# Patient Record
Sex: Female | Born: 1935 | Race: White | Hispanic: No | Marital: Married
Health system: Southern US, Community
[De-identification: ages and names within clinical notes are randomized; demographics above are authoritative.]

## PROBLEM LIST (undated history)

## (undated) DIAGNOSIS — I219 Acute myocardial infarction, unspecified: Secondary | ICD-10-CM

## (undated) DIAGNOSIS — I639 Cerebral infarction, unspecified: Secondary | ICD-10-CM

## (undated) DIAGNOSIS — I10 Essential (primary) hypertension: Secondary | ICD-10-CM

## (undated) DIAGNOSIS — E119 Type 2 diabetes mellitus without complications: Secondary | ICD-10-CM

---

## 2002-08-13 ENCOUNTER — Encounter: Payer: Self-pay | Admitting: Emergency Medicine

## 2002-08-13 ENCOUNTER — Inpatient Hospital Stay (HOSPITAL_COMMUNITY): Admission: EM | Admit: 2002-08-13 | Discharge: 2002-08-16 | Payer: Self-pay | Admitting: Emergency Medicine

## 2016-12-24 ENCOUNTER — Encounter (HOSPITAL_COMMUNITY): Payer: Self-pay | Admitting: Emergency Medicine

## 2016-12-24 ENCOUNTER — Emergency Department (HOSPITAL_COMMUNITY): Payer: Medicare Other

## 2016-12-24 ENCOUNTER — Emergency Department (HOSPITAL_COMMUNITY)
Admission: EM | Admit: 2016-12-24 | Discharge: 2016-12-24 | Disposition: A | Discharge status: Home / Self Care | Payer: Medicare Other | Attending: Emergency Medicine | Admitting: Emergency Medicine

## 2016-12-24 DIAGNOSIS — E119 Type 2 diabetes mellitus without complications: Secondary | ICD-10-CM | POA: Insufficient documentation

## 2016-12-24 DIAGNOSIS — W19XXXA Unspecified fall, initial encounter: Secondary | ICD-10-CM

## 2016-12-24 DIAGNOSIS — I10 Essential (primary) hypertension: Secondary | ICD-10-CM | POA: Insufficient documentation

## 2016-12-24 DIAGNOSIS — Z7982 Long term (current) use of aspirin: Secondary | ICD-10-CM | POA: Diagnosis not present

## 2016-12-24 DIAGNOSIS — Y998 Other external cause status: Secondary | ICD-10-CM | POA: Insufficient documentation

## 2016-12-24 DIAGNOSIS — I252 Old myocardial infarction: Secondary | ICD-10-CM | POA: Insufficient documentation

## 2016-12-24 DIAGNOSIS — Y92009 Unspecified place in unspecified non-institutional (private) residence as the place of occurrence of the external cause: Secondary | ICD-10-CM | POA: Diagnosis not present

## 2016-12-24 DIAGNOSIS — S300XXA Contusion of lower back and pelvis, initial encounter: Secondary | ICD-10-CM | POA: Insufficient documentation

## 2016-12-24 DIAGNOSIS — Y9301 Activity, walking, marching and hiking: Secondary | ICD-10-CM | POA: Insufficient documentation

## 2016-12-24 DIAGNOSIS — W010XXA Fall on same level from slipping, tripping and stumbling without subsequent striking against object, initial encounter: Secondary | ICD-10-CM | POA: Diagnosis not present

## 2016-12-24 DIAGNOSIS — Z8673 Personal history of transient ischemic attack (TIA), and cerebral infarction without residual deficits: Secondary | ICD-10-CM | POA: Insufficient documentation

## 2016-12-24 DIAGNOSIS — S20222A Contusion of left back wall of thorax, initial encounter: Secondary | ICD-10-CM

## 2016-12-24 DIAGNOSIS — R900 Intracranial space-occupying lesion found on diagnostic imaging of central nervous system: Secondary | ICD-10-CM | POA: Insufficient documentation

## 2016-12-24 DIAGNOSIS — Z794 Long term (current) use of insulin: Secondary | ICD-10-CM | POA: Insufficient documentation

## 2016-12-24 DIAGNOSIS — Z79899 Other long term (current) drug therapy: Secondary | ICD-10-CM | POA: Insufficient documentation

## 2016-12-24 DIAGNOSIS — G939 Disorder of brain, unspecified: Secondary | ICD-10-CM

## 2016-12-24 DIAGNOSIS — S3992XA Unspecified injury of lower back, initial encounter: Secondary | ICD-10-CM | POA: Diagnosis present

## 2016-12-24 HISTORY — DX: Acute myocardial infarction, unspecified: I21.9

## 2016-12-24 HISTORY — DX: Type 2 diabetes mellitus without complications: E11.9

## 2016-12-24 HISTORY — DX: Cerebral infarction, unspecified: I63.9

## 2016-12-24 HISTORY — DX: Essential (primary) hypertension: I10

## 2016-12-24 LAB — CBC WITH DIFFERENTIAL/PLATELET
BASOS PCT: 0 %
Basophils Absolute: 0 10*3/uL (ref 0.0–0.1)
EOS ABS: 0.3 10*3/uL (ref 0.0–0.7)
EOS PCT: 3 %
HCT: 39.3 % (ref 36.0–46.0)
HEMOGLOBIN: 12.8 g/dL (ref 12.0–15.0)
LYMPHS ABS: 1 10*3/uL (ref 0.7–4.0)
Lymphocytes Relative: 11 %
MCH: 30.3 pg (ref 26.0–34.0)
MCHC: 32.6 g/dL (ref 30.0–36.0)
MCV: 93.1 fL (ref 78.0–100.0)
Monocytes Absolute: 0.6 10*3/uL (ref 0.1–1.0)
Monocytes Relative: 7 %
NEUTROS PCT: 79 %
Neutro Abs: 7 10*3/uL (ref 1.7–7.7)
PLATELETS: 227 10*3/uL (ref 150–400)
RBC: 4.22 MIL/uL (ref 3.87–5.11)
RDW: 12.9 % (ref 11.5–15.5)
WBC: 8.9 10*3/uL (ref 4.0–10.5)

## 2016-12-24 LAB — COMPREHENSIVE METABOLIC PANEL
ALBUMIN: 3.5 g/dL (ref 3.5–5.0)
ALK PHOS: 103 U/L (ref 38–126)
ALT: 9 U/L — AB (ref 14–54)
AST: 22 U/L (ref 15–41)
Anion gap: 10 (ref 5–15)
BUN: 10 mg/dL (ref 6–20)
CHLORIDE: 102 mmol/L (ref 101–111)
CO2: 25 mmol/L (ref 22–32)
CREATININE: 0.8 mg/dL (ref 0.44–1.00)
Calcium: 9.3 mg/dL (ref 8.9–10.3)
GFR calc non Af Amer: >60 mL/min (ref >60)
GLUCOSE: 173 mg/dL — AB (ref 65–99)
Potassium: 3.5 mmol/L (ref 3.5–5.1)
SODIUM: 137 mmol/L (ref 135–145)
Total Bilirubin: 1.9 mg/dL — ABNORMAL HIGH (ref 0.3–1.2)
Total Protein: 6.9 g/dL (ref 6.5–8.1)

## 2016-12-24 LAB — CK: CK TOTAL: 187 U/L (ref 38–234)

## 2016-12-24 LAB — I-STAT TROPONIN, ED: Troponin i, poc: 0.01 ng/mL (ref 0.00–0.08)

## 2016-12-24 MED ORDER — SODIUM CHLORIDE 0.9 % IV BOLUS (SEPSIS)
1000.0000 mL | Freq: Once | INTRAVENOUS | Status: AC
  Administered 2016-12-24: 1000 mL via INTRAVENOUS

## 2016-12-24 NOTE — ED Provider Notes (Addendum)
[facility]-EMERGENCY DEPT Provider Note   CSN: [id number] Arrival date & time: 12/24/16  [id number]     History   Chief Complaint Chief Complaint  Patient presents with  . Fall    HPI Monica Thornton is a 83 y.o. female hx of DM, HTN, MI, previous stroke Here presenting with fall. Patient is currently at an independent living facility and states that she was walking yesterday and tripped and fell onto the left side. Patient was on the floor overnight and could not get up. She had an aide that came to see her and found her on the floor. Patient denies any passing out or chest pain or shortness of breath. Complaining of some left sided back pain and leg pain. Patient states that she is not on blood thinners.   The history is provided by the patient.    Past Medical History:  Diagnosis Date  . Diabetes mellitus without complication (HCC)   . Hypertension   . MI (myocardial infarction) (HCC)   . Stroke [facility](HCC)     There are no active problems to display for this patient.   No past surgical history on file.  OB History    No data available       Home Medications    Prior to Admission medications   Medication Sig Start Date End Date Taking? Authorizing Provider  aspirin EC 325 MG tablet Take 325 mg by mouth daily.   Yes [provider]  cefdinir (OMNICEF) 300 MG capsule Take 300 mg by mouth at bedtime.   Yes [provider]  DULoxetine (CYMBALTA) 30 MG capsule Take 30 mg by mouth daily.   Yes [provider]  Insulin Glargine (BASAGLAR KWIKPEN) 100 UNIT/ML SOPN Inject 30 Units into the skin daily after breakfast.   Yes [provider]  levothyroxine (SYNTHROID, LEVOTHROID) 75 MCG tablet Take 75 mcg by mouth daily before breakfast.   Yes [provider]  lisinopril (PRINIVIL,ZESTRIL) 40 MG tablet Take 40 mg by mouth daily.   Yes [provider]  metoprolol succinate (TOPROL-XL) 25 MG 24 hr tablet Take 25 mg by mouth daily.   Yes  [provider]  Multiple Vitamin (MULTIVITAMIN WITH MINERALS) TABS tablet Take 1 tablet by mouth daily.   Yes [provider]  oxybutynin (DITROPAN-XL) 10 MG 24 hr tablet Take 10 mg by mouth at bedtime.   Yes [provider]    Family History No family history on file.  Social History Social History  Substance Use Topics  . Smoking status: Not on file  . Smokeless tobacco: Not on file  . Alcohol use Not on file     Allergies   Avandia [rosiglitazone] and Ciprofloxacin   Review of Systems Review of Systems  Musculoskeletal: Positive for back pain.  All other systems reviewed and are negative.    Physical Exam Updated Vital Signs BP (!) 145/66   Pulse (!) 52   Temp 97.7 F (36.5 C) (Oral)   Resp (!) 7   SpO2 96%   Physical Exam  Constitutional: She is oriented to person, place, and time.  Chronically ill, slightly dehydrated   HENT:  Head: Normocephalic.  MM slightly dry, no obvious scalp hematoma   Eyes: Pupils are equal, round, and reactive to light. Conjunctivae and EOM are normal.  Neck: Normal range of motion. Neck supple.  Cardiovascular: Normal rate, regular rhythm and normal heart sounds.   Pulmonary/Chest: Effort normal and breath sounds normal. No respiratory distress. She has  no wheezes. She has no rales.  Abdominal: Soft. Bowel sounds are normal. She exhibits no distension. There is no tenderness.  Musculoskeletal:  Bruising L buttock, nl ROM bilateral hips, no obvious hip deformity. Mild L paralumbar tenderness, no midline tenderness. Mild tenderness R tibia, no obvious deformity   Neurological: She is alert and oriented to person, place, and time. No cranial nerve deficit. Coordination normal.  Skin: Skin is warm.  Psychiatric: She has a normal mood and affect.  Nursing note and vitals reviewed.    ED Treatments / Results  Labs (all labs ordered are listed, but only abnormal results are displayed) Labs Reviewed    COMPREHENSIVE METABOLIC PANEL - Abnormal; Notable for the following:       Result Value   Glucose, Bld 173 (*)    ALT 9 (*)    Total Bilirubin 1.9 (*)    All other components within normal limits  CBC WITH DIFFERENTIAL/PLATELET  CK  I-STAT TROPONIN, ED    EKG  EKG Interpretation  Date/Time:  Wednesday December 24 2016 09:48:27 EDT Ventricular Rate:  68 PR Interval:    QRS Duration: 115 QT Interval:  418 QTC Calculation: 445 R Axis:   -55 Text Interpretation:  Unknown rhythm, irregular rate Borderline prolonged PR interval Left anterior fascicular block LVH with secondary repolarization abnormality Anterior infarct, old No significant change since last tracing Confirmed by Richardean Canal [phone]) on 12/24/2016 9:57:23 AM       Radiology Dg Chest 1 View  Result Date: 12/24/2016 CLINICAL DATA:  Recent fall with chest pain, initial encounter EXAM: CHEST 1 VIEW COMPARISON:  None. FINDINGS: Cardiac shadow is enlarged. Lungs are well aerated bilaterally. No focal infiltrate is seen. Postsurgical changes are noted. Degenerative changes about the shoulder joints are seen. IMPRESSION: No active disease. Electronically Signed   By: Alcide Clever M.D.   On: 12/24/2016 11:03   Dg Lumbar Spine Complete  Result Date: 12/24/2016 CLINICAL DATA:  Recent fall today with low back pain, initial encounter EXAM: LUMBAR SPINE - COMPLETE 4+ VIEW COMPARISON:  None. FINDINGS: Five lumbar type vertebral bodies are well visualized. Vertebral body height is well maintained. Partial fusion of L2 on L3 is noted anteriorly. Multilevel disc space narrowing with osteophytic changes are noted. No pars defects are seen. Aortic calcifications are noted. No soft tissue abnormality is seen. IMPRESSION: Chronic changes without acute abnormality. Electronically Signed   By: Alcide Clever M.D.   On: 12/24/2016 11:00   Dg Pelvis 1-2 Views  Result Date: 12/24/2016 CLINICAL DATA:  Recent fall with pelvic pain, initial encounter  EXAM: PELVIS - 1-2 VIEW COMPARISON:  None. FINDINGS: Pelvic ring is intact. Degenerative changes of lumbar spine and hip joints are noted. No acute fracture is seen. No soft tissue abnormality is noted. IMPRESSION: Degenerative changes without acute abnormality. Electronically Signed   By: Alcide Clever M.D.   On: 12/24/2016 10:59   Dg Tibia/fibula Right  Result Date: 12/24/2016 CLINICAL DATA:  Recent fall today with leg pain, initial encounter EXAM: RIGHT TIBIA AND FIBULA - 2 VIEW COMPARISON:  None. FINDINGS: Right knee prosthesis is seen in satisfactory position. No findings to suggest loosening are seen. No acute fracture or dislocation is noted. Soft tissue calcifications are noted. Calcaneal spurring and tarsal degenerative changes are seen. IMPRESSION: Chronic changes without acute abnormality. Electronically Signed   By: Alcide Clever M.D.   On: 12/24/2016 10:59   Ct Head Wo Contrast  Result Date: 12/24/2016 CLINICAL DATA:  Fall.  EXAM: CT HEAD WITHOUT CONTRAST CT CERVICAL SPINE WITHOUT CONTRAST TECHNIQUE: Multidetector CT imaging of the head and cervical spine was performed following the standard protocol without intravenous contrast. Multiplanar CT image reconstructions of the cervical spine were also generated. COMPARISON:  None. FINDINGS: CT HEAD FINDINGS Brain: No evidence of acute infarction, hemorrhage, hydrocephalus or mass effect. There is a 10 mm focal hyperdense extra-axial lesion overlying the mid left superior frontal gyrus. Age-related cerebral atrophy with compensatory dilatation of the ventricles. Periventricular white matter and corona radiata hypodensities favor chronic ischemic microvascular white matter disease. Vascular: Intracranial atherosclerotic vascular calcifications. No hyperdense vessel. Skull: Normal. Negative for fracture or focal lesion. Sinuses/Orbits: Tiny mucous retention cysts in the bilateral maxillary sinuses. The remaining paranasal sinuses and mastoid air cells are  clear. The orbits are unremarkable. Other: None. CT CERVICAL SPINE FINDINGS Alignment: Trace anterolisthesis of C4 on C5. Trace retrolisthesis of C5 on C6. 3 mm anterolisthesis of T2 on T3. Skull base and vertebrae: No definite acute fracture. No primary bone lesion or focal pathologic process. Soft tissues and spinal canal: Mild prevertebral soft tissue swelling, measuring up to 9 mm at C3. No visible spinal canal hematoma. Disc levels: Multilevel degenerative changes throughout the cervical spine. Severe bilateral facet arthropathy. Upper chest: The visualized lungs are clear. Atherosclerotic vascular calcifications of the thoracic aorta and branch vessels. There is a 16 mm partially calcified right thyroid nodule. Other: None. IMPRESSION: 1. No definite acute intracranial abnormality. A 10 mm focal hyperdense extra-axial lesion overlying the mid left superior frontal gyrus is felt to be a meningioma given its location and configuration, with a small subdural hematoma thought to be much less likely given lack of other intracranial traumatic findings. 2. Mild prevertebral soft tissue swelling without discrete cervical spine fracture. Recommend noncontrast MRI cervical spine for further evaluation to evaluate for ligamentous injury. Electronically Signed   By: Obie Dredge M.D.   On: 12/24/2016 11:38   Ct Cervical Spine Wo Contrast  Result Date: 12/24/2016 CLINICAL DATA:  Fall. EXAM: CT HEAD WITHOUT CONTRAST CT CERVICAL SPINE WITHOUT CONTRAST TECHNIQUE: Multidetector CT imaging of the head and cervical spine was performed following the standard protocol without intravenous contrast. Multiplanar CT image reconstructions of the cervical spine were also generated. COMPARISON:  None. FINDINGS: CT HEAD FINDINGS Brain: No evidence of acute infarction, hemorrhage, hydrocephalus or mass effect. There is a 10 mm focal hyperdense extra-axial lesion overlying the mid left superior frontal gyrus. Age-related cerebral  atrophy with compensatory dilatation of the ventricles. Periventricular white matter and corona radiata hypodensities favor chronic ischemic microvascular white matter disease. Vascular: Intracranial atherosclerotic vascular calcifications. No hyperdense vessel. Skull: Normal. Negative for fracture or focal lesion. Sinuses/Orbits: Tiny mucous retention cysts in the bilateral maxillary sinuses. The remaining paranasal sinuses and mastoid air cells are clear. The orbits are unremarkable. Other: None. CT CERVICAL SPINE FINDINGS Alignment: Trace anterolisthesis of C4 on C5. Trace retrolisthesis of C5 on C6. 3 mm anterolisthesis of T2 on T3. Skull base and vertebrae: No definite acute fracture. No primary bone lesion or focal pathologic process. Soft tissues and spinal canal: Mild prevertebral soft tissue swelling, measuring up to 9 mm at C3. No visible spinal canal hematoma. Disc levels: Multilevel degenerative changes throughout the cervical spine. Severe bilateral facet arthropathy. Upper chest: The visualized lungs are clear. Atherosclerotic vascular calcifications of the thoracic aorta and branch vessels. There is a 16 mm partially calcified right thyroid nodule. Other: None. IMPRESSION: 1. No definite acute intracranial abnormality. A 10  mm focal hyperdense extra-axial lesion overlying the mid left superior frontal gyrus is felt to be a meningioma given its location and configuration, with a small subdural hematoma thought to be much less likely given lack of other intracranial traumatic findings. 2. Mild prevertebral soft tissue swelling without discrete cervical spine fracture. Recommend noncontrast MRI cervical spine for further evaluation to evaluate for ligamentous injury. Electronically Signed   By: Obie Dredge M.D.   On: 12/24/2016 11:38    Procedures Procedures (including critical care time)  Medications Ordered in ED Medications  sodium chloride 0.9 % bolus 1,000 mL (0 mLs Intravenous Stopped  12/24/16 1302)     Initial Impression / Assessment and Plan / ED Course  I have reviewed the triage vital signs and the nursing notes.  Pertinent labs & imaging results that were available during my care of the patient were reviewed by me and considered in my medical decision making (see chart for details).     Victoriah Wilds is a 83 y.o. female here with fall. Has bruising L buttock. Consider pelvic vs hip fracture vs rhabdo. Will get labs, CT head/neck, xrays.   1:06 PM CT head, neck unremarkable. Xrays showed no fracture. Able to ambulate. I doubt occult fracture. Of note, patient has small extra axial lesion, no signs of scalp hematoma on my exam I doubt subdural and this can be followed up outpatient. Will dc back to facility.   Final Clinical Impressions(s) / ED Diagnoses   Final diagnoses:  Fall, initial encounter  Back contusion, left, initial encounter  Brain lesion    New Prescriptions New Prescriptions   No medications on file     Charlynne Pander, MD 12/24/16 1303    Charlynne Pander, MD 12/24/16 [phone]

## 2016-12-24 NOTE — ED Triage Notes (Signed)
[facility] after being found on the floor of her apartment this morning by staff.  EMS reports patient told them the patient looked at her watch when she fell and that she fell at 1400 yesterday.  Patient is alert and oriented at this time, complains of no pain.  No obvious deformities, bruising noted to lower back.

## 2016-12-24 NOTE — ED Notes (Signed)
Pt ambulated well with a walker, steady gait

## 2016-12-24 NOTE — Discharge Instructions (Signed)
Take tylenol as needed for pain.   Expect bruising on your buttock area to get worse before it gets better. Apply ice on it today.   You have a small brain lesion that was there before and can be followed up with your doctor later   See your doctor  Return to ER if you have severe pain, unable to walk, passing out, headaches, vomiting.

## 2018-11-27 DEATH — deceased

## 2019-02-11 IMAGING — CT CT HEAD W/O CM
4 of 8 series · 16 of 47 positions shown, 18 images · non-contrast
Comparison: None.

CLINICAL DATA: Fall.

EXAM:
CT HEAD WITHOUT CONTRAST
CT CERVICAL SPINE WITHOUT CONTRAST
TECHNIQUE: Multidetector CT imaging of the head and cervical spine was
performed following the standard protocol without intravenous
contrast. Multiplanar CT image reconstructions of the cervical spine
were also generated.

[Series 6: head bone · axial · 0.44mm/px · z∈[-119,-35]mm · 4 of 86 slices shown]
[im 15/86  bone]
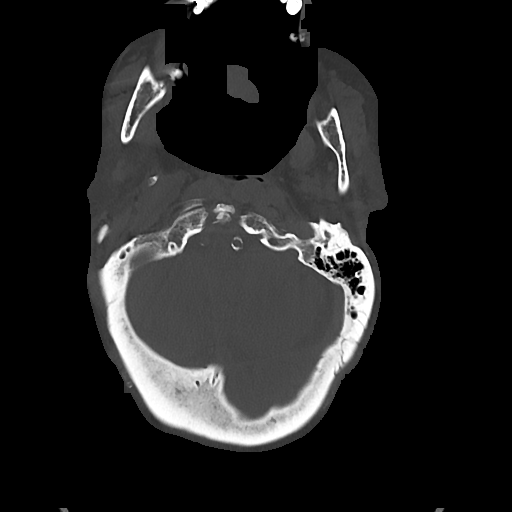
[im 29/86  bone]
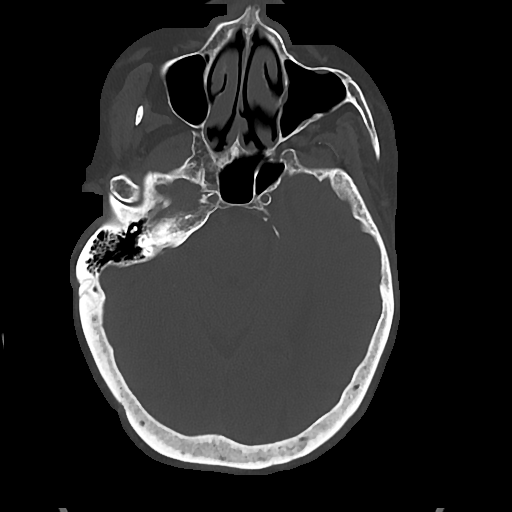
[im 43/86  bone]
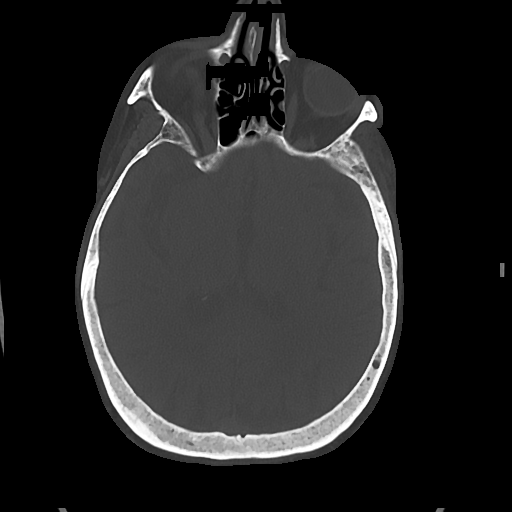
[im 57/86  bone]
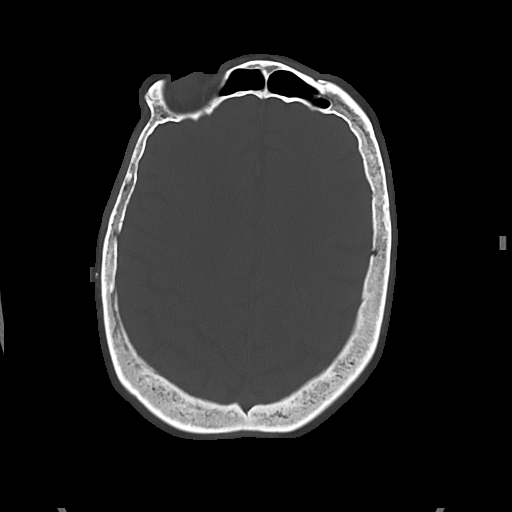

[Series 7: cor soft · coronal · 0.32mm/px · 3 of 67 slices shown]
[im 17/67  brain]
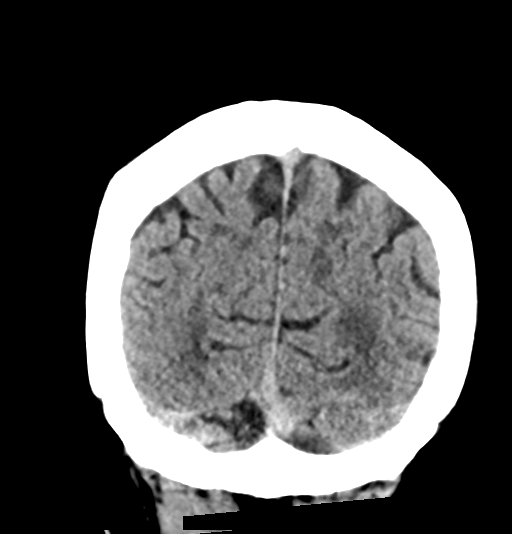
[im 34/67  brain]
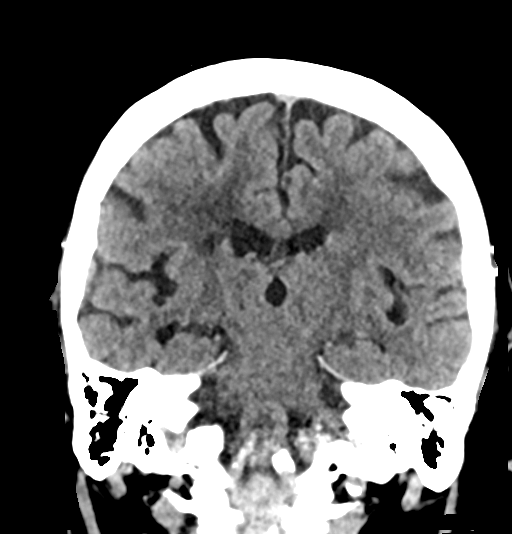
[im 50/67  brain]
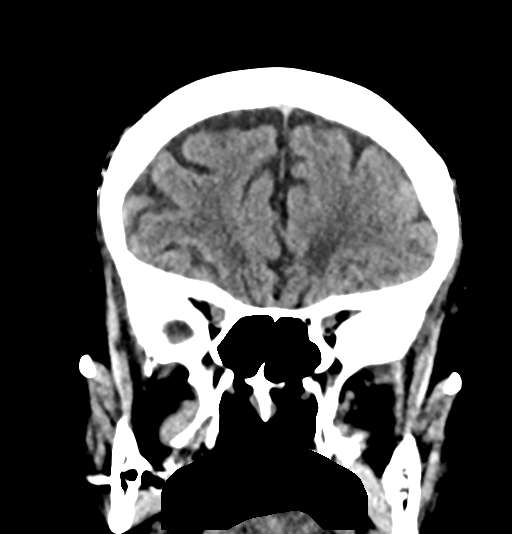

[Series 8: sag soft · sagittal · 0.33mm/px · 1 of 54 slices shown]
[im 27/54  brain]
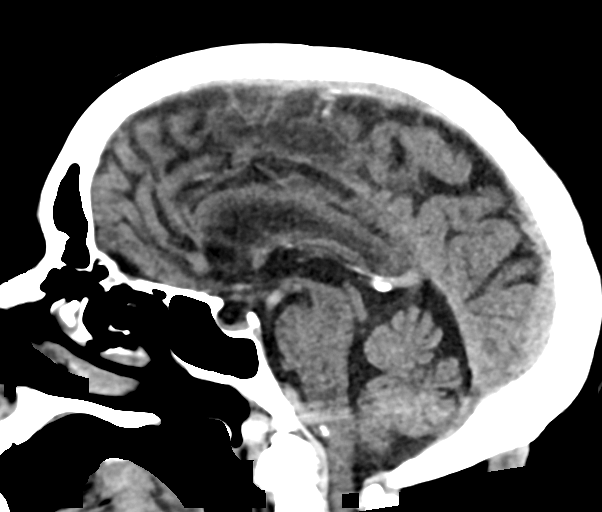

[Series 12: orthogonal axials · axial · 0.21mm/px · z∈[-302,-139]mm · 8 of 124 slices shown, 10 images]
[im 14/124  brain]
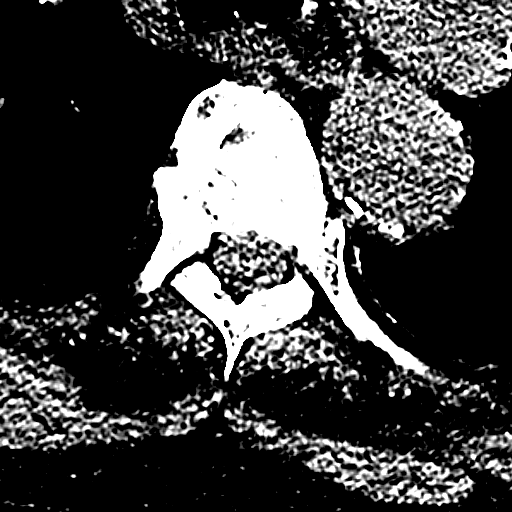
[im 14/124  bone]
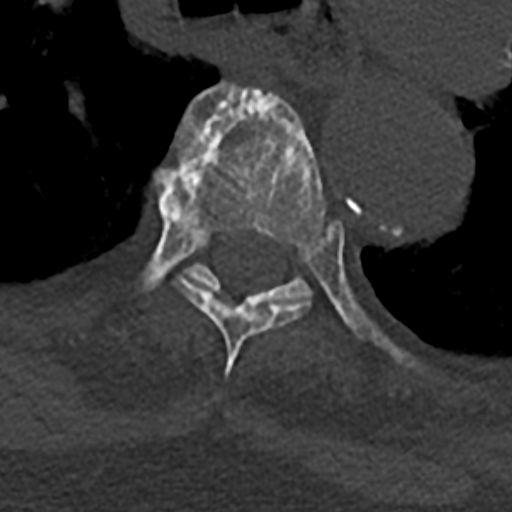
[im 28/124  brain]
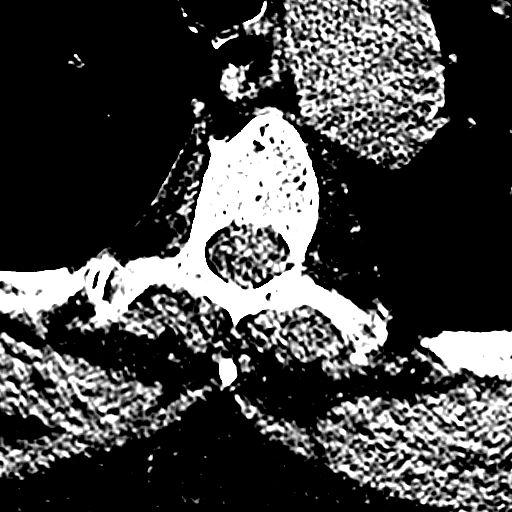
[im 42/124  brain]
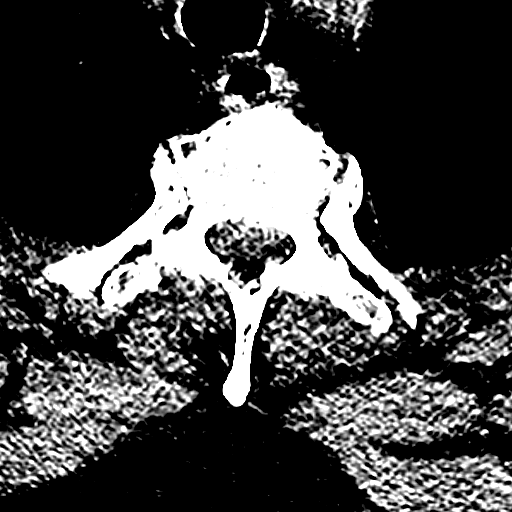
[im 55/124  brain]
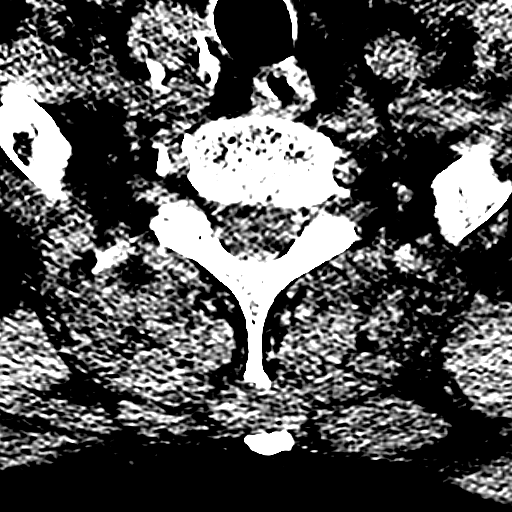
[im 69/124  brain]
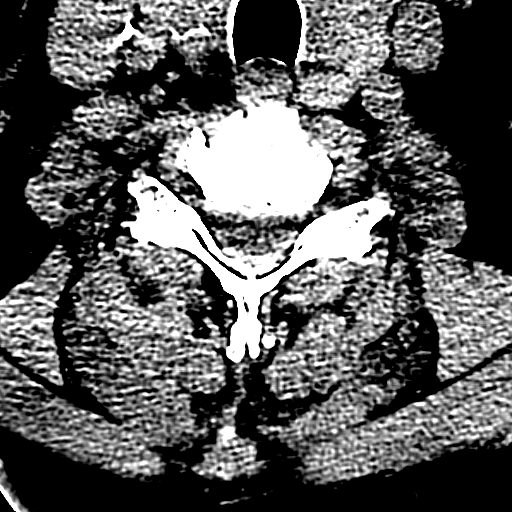
[im 69/124  bone]
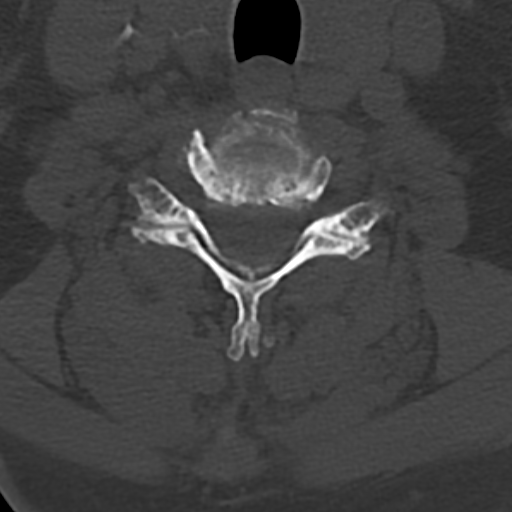
[im 83/124  brain]
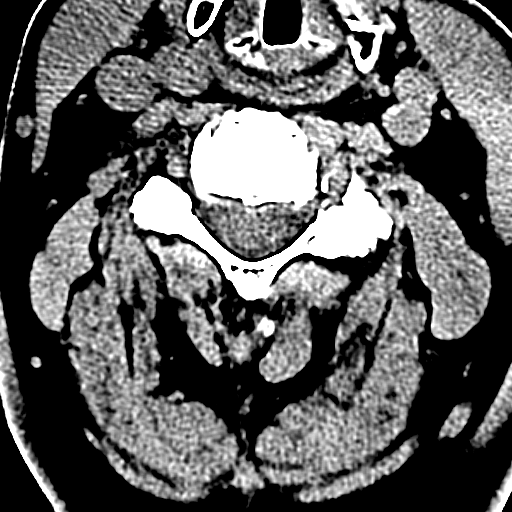
[im 96/124  brain]
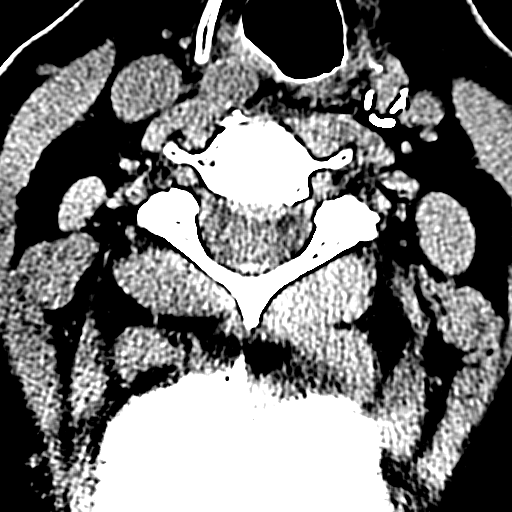
[im 110/124  brain]
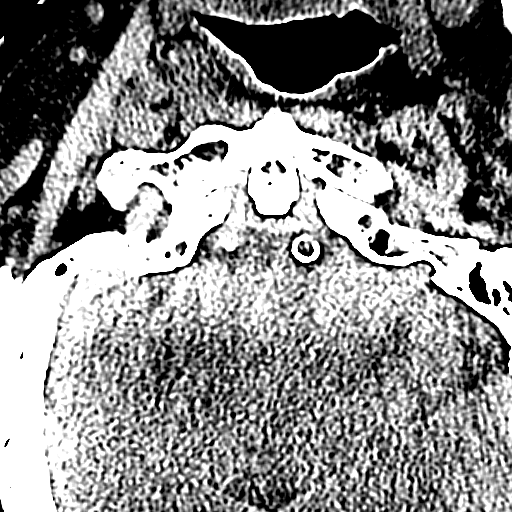

[16 of 47 positions shown; findings below may reference images not displayed]

FINDINGS: CT HEAD FINDINGS

Brain: No evidence of acute infarction, hemorrhage, hydrocephalus or
mass effect. There is a 10 mm focal hyperdense extra-axial lesion
overlying the mid left superior frontal gyrus. Age-related cerebral
atrophy with compensatory dilatation of the ventricles.
Periventricular white matter and corona radiata hypodensities favor
chronic ischemic microvascular white matter disease.

Vascular: Intracranial atherosclerotic vascular calcifications. No
hyperdense vessel.

Skull: Normal. Negative for fracture or focal lesion.

Sinuses/Orbits: Tiny mucous retention cysts in the bilateral
maxillary sinuses. The remaining paranasal sinuses and mastoid air
cells are clear. The orbits are unremarkable.

Other: None.

CT CERVICAL SPINE FINDINGS

Alignment: Trace anterolisthesis of C4 on C5. Trace retrolisthesis
of C5 on C6. 3 mm anterolisthesis of T2 on T3.

Skull base and vertebrae: No definite acute fracture. No primary
bone lesion or focal pathologic process.

Soft tissues and spinal canal: Mild prevertebral soft tissue
swelling, measuring up to 9 mm at C3. No visible spinal canal
hematoma.

Disc levels: Multilevel degenerative changes throughout the cervical
spine. Severe bilateral facet arthropathy.

Upper chest: The visualized lungs are clear. Atherosclerotic
vascular calcifications of the thoracic aorta and branch vessels.
There is a 16 mm partially calcified right thyroid nodule.

Other: None.
IMPRESSION: 1. No definite acute intracranial abnormality. A 10 mm focal
hyperdense extra-axial lesion overlying the mid left superior
frontal gyrus is felt to be a meningioma given its location and
configuration, with a small subdural hematoma thought to be much
less likely given lack of other intracranial traumatic findings.
2. Mild prevertebral soft tissue swelling without discrete cervical
spine fracture. Recommend noncontrast MRI cervical spine for further
evaluation to evaluate for ligamentous injury.

## 2019-02-11 IMAGING — DX DG LUMBAR SPINE COMPLETE 4+V
6 series · 6 of 6 positions shown · non-contrast
Comparison: None.

CLINICAL DATA: Recent fall today with low back pain, initial
encounter

EXAM:
LUMBAR SPINE - COMPLETE 4+ VIEW

[l-spine ap]
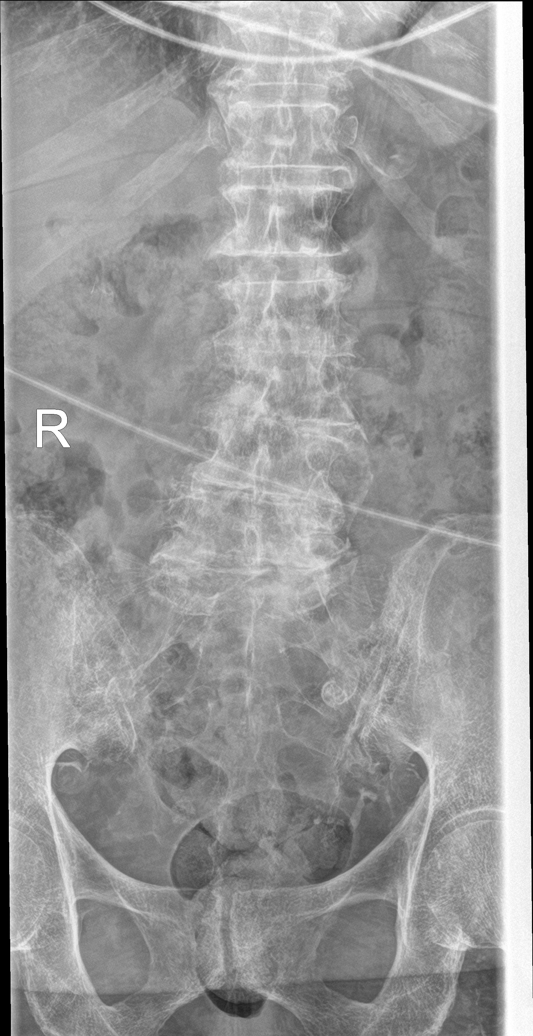

[l-spine obl (1 of 2)]
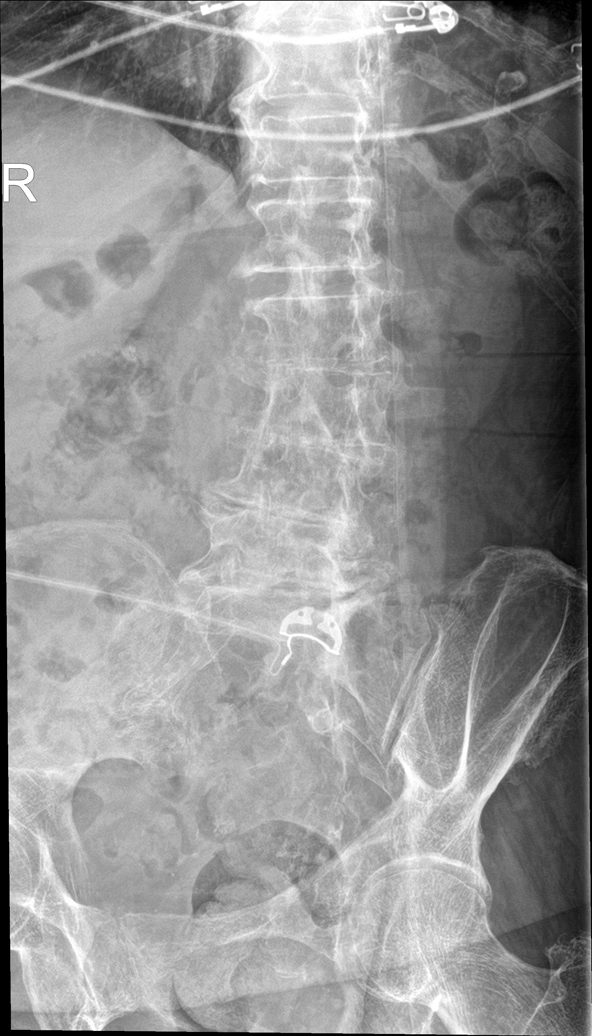

[l-spine obl (2 of 2)]
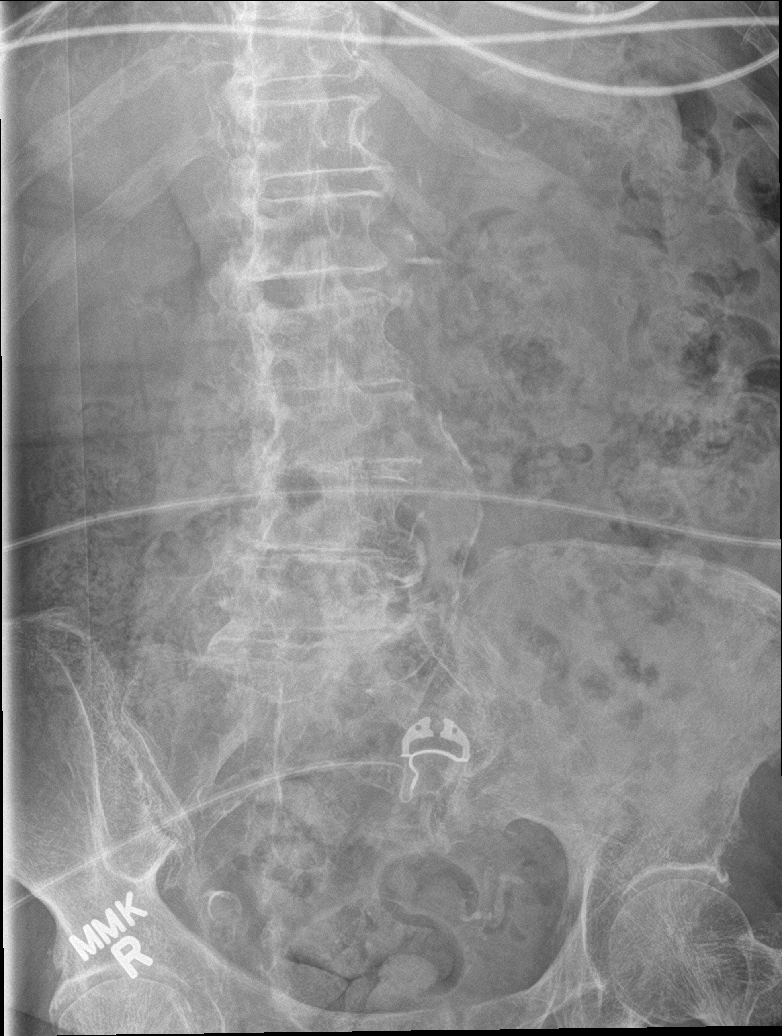

[l-spine lat (1 of 2)]
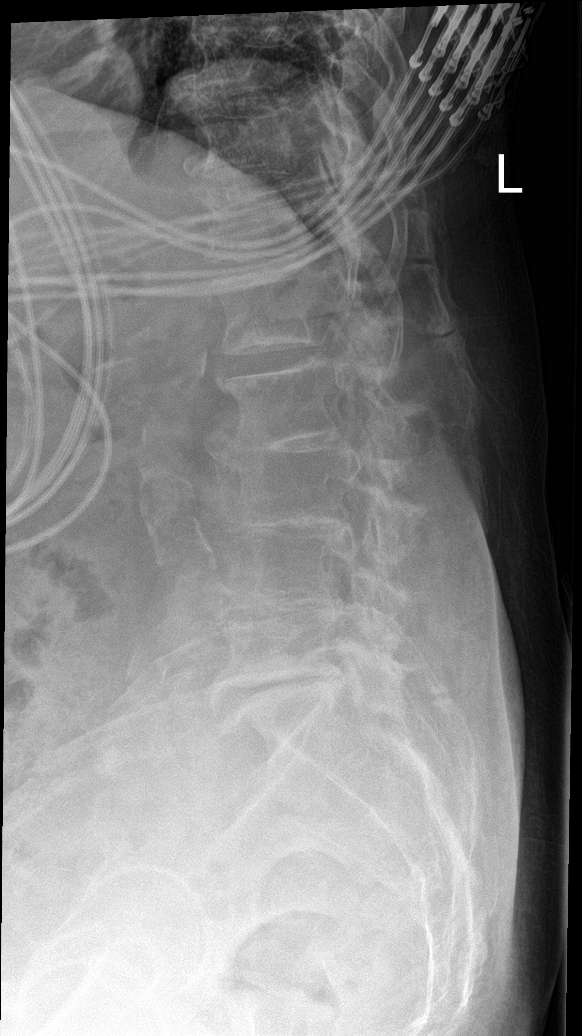

[l-spine spot]
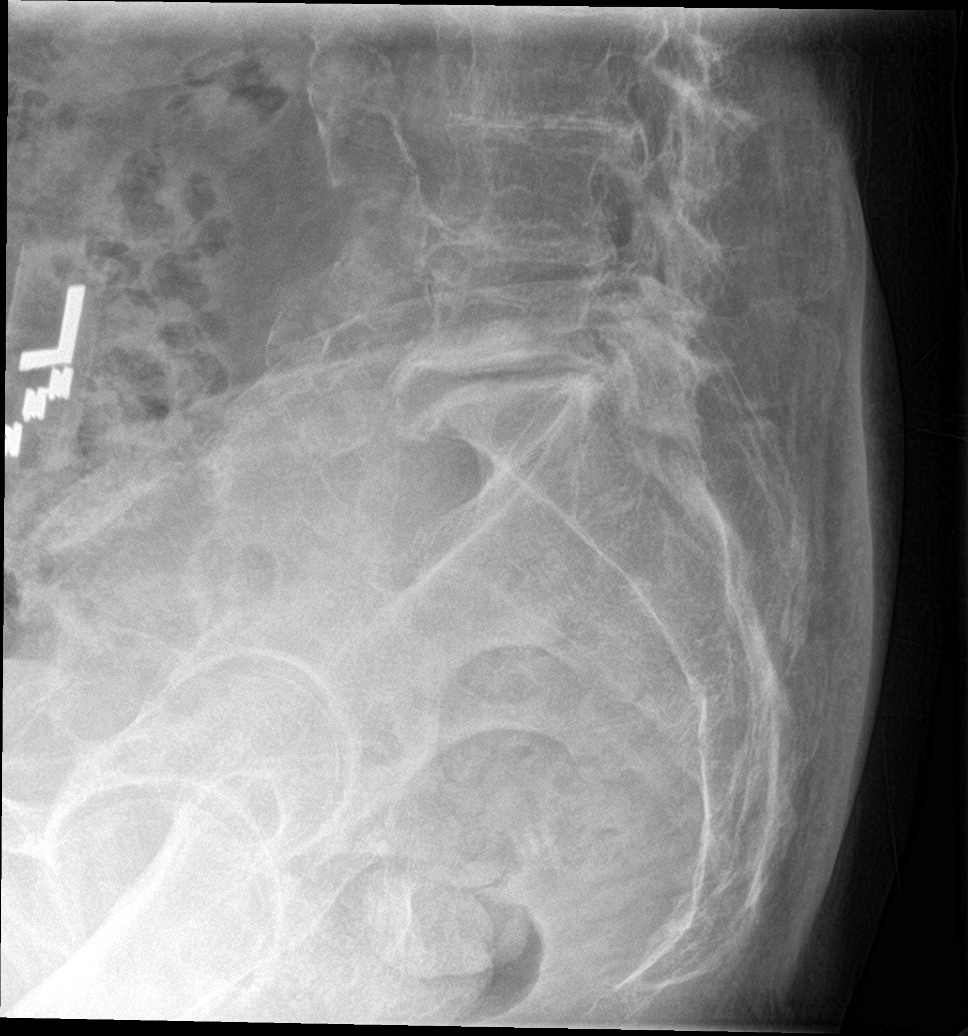

[l-spine lat (2 of 2)]
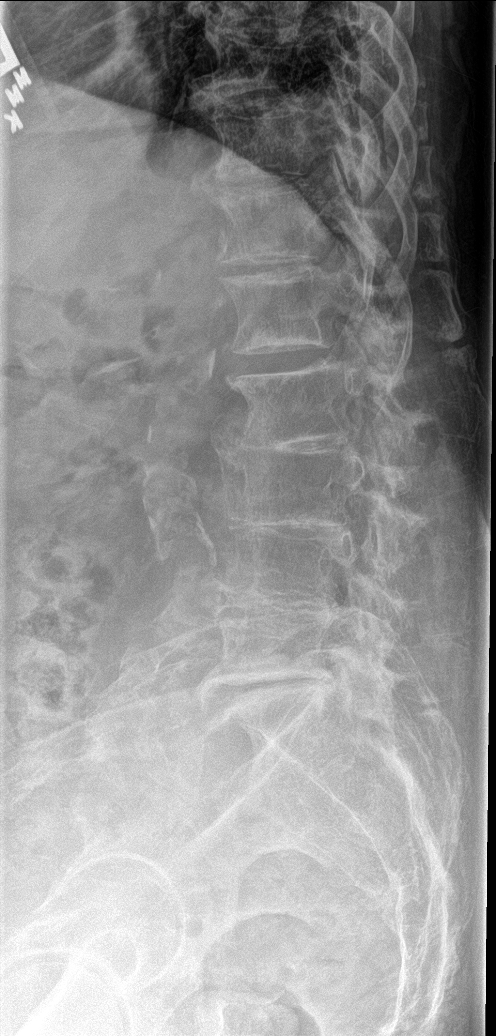

[6 of 6 positions shown; findings below may reference images not displayed]

FINDINGS: Five lumbar type vertebral bodies are well visualized. Vertebral
body height is well maintained. Partial fusion of L2 on L3 is noted
anteriorly. Multilevel disc space narrowing with osteophytic changes
are noted. No pars defects are seen. Aortic calcifications are
noted. No soft tissue abnormality is seen.
IMPRESSION: Chronic changes without acute abnormality.
